# Patient Record
Sex: Female | Born: 1975 | Race: White | Hispanic: No | Marital: Married | Smoking: Never smoker
Health system: Southern US, Community
[De-identification: ages and names within clinical notes are randomized; demographics above are authoritative.]

## PROBLEM LIST (undated history)

## (undated) DIAGNOSIS — E039 Hypothyroidism, unspecified: Secondary | ICD-10-CM

## (undated) DIAGNOSIS — K219 Gastro-esophageal reflux disease without esophagitis: Secondary | ICD-10-CM

## (undated) DIAGNOSIS — E282 Polycystic ovarian syndrome: Secondary | ICD-10-CM

## (undated) DIAGNOSIS — F32A Depression, unspecified: Secondary | ICD-10-CM

## (undated) DIAGNOSIS — F329 Major depressive disorder, single episode, unspecified: Secondary | ICD-10-CM

## (undated) HISTORY — DX: Polycystic ovarian syndrome: E28.2

## (undated) HISTORY — PX: KNEE ARTHROSCOPY: SHX127

---

## 2009-06-19 ENCOUNTER — Inpatient Hospital Stay (HOSPITAL_COMMUNITY): Admission: AD | Admit: 2009-06-19 | Discharge: 2009-06-19 | Payer: Self-pay | Admitting: Obstetrics and Gynecology

## 2009-06-23 ENCOUNTER — Inpatient Hospital Stay (HOSPITAL_COMMUNITY): Admission: RE | Admit: 2009-06-23 | Discharge: 2009-06-25 | Payer: Self-pay | Admitting: Obstetrics and Gynecology

## 2010-05-19 LAB — CBC
HCT: 33.6 % — ABNORMAL LOW (ref 36.0–46.0)
Hemoglobin: 11 g/dL — ABNORMAL LOW (ref 12.0–15.0)
Hemoglobin: 8.8 g/dL — ABNORMAL LOW (ref 12.0–15.0)
MCHC: 33.2 g/dL (ref 30.0–36.0)
MCV: 82.2 fL (ref 78.0–100.0)
MCV: 83.1 fL (ref 78.0–100.0)
Platelets: 284 10*3/uL (ref 150–400)
Platelets: 326 10*3/uL (ref 150–400)
Platelets: 331 10*3/uL (ref 150–400)
RBC: 3.94 MIL/uL (ref 3.87–5.11)
RDW: 14.3 % (ref 11.5–15.5)
RDW: 14.5 % (ref 11.5–15.5)
WBC: 12.4 10*3/uL — ABNORMAL HIGH (ref 4.0–10.5)
WBC: 9.2 10*3/uL (ref 4.0–10.5)

## 2010-05-19 LAB — COMPREHENSIVE METABOLIC PANEL
Albumin: 2.9 g/dL — ABNORMAL LOW (ref 3.5–5.2)
Alkaline Phosphatase: 111 U/L (ref 39–117)
BUN: 6 mg/dL (ref 6–23)
Chloride: 107 mEq/L (ref 96–112)
Creatinine, Ser: 0.69 mg/dL (ref 0.4–1.2)
Glucose, Bld: 76 mg/dL (ref 70–99)
Total Bilirubin: 0.4 mg/dL (ref 0.3–1.2)

## 2010-05-19 LAB — TYPE AND SCREEN
ABO/RH(D): O NEG
Antibody Screen: NEGATIVE

## 2010-05-19 LAB — RH IMMUNE GLOB WKUP(>/=20WKS)(NOT WOMEN'S HOSP): Fetal Screen: NEGATIVE

## 2010-05-19 LAB — URIC ACID: Uric Acid, Serum: 4.1 mg/dL (ref 2.4–7.0)

## 2010-05-19 LAB — RPR: RPR Ser Ql: NONREACTIVE

## 2010-05-19 LAB — ABO/RH: ABO/RH(D): O NEG

## 2010-10-30 LAB — ABO/RH: RH Type: NEGATIVE

## 2010-10-30 LAB — HEPATITIS B SURFACE ANTIGEN: Hepatitis B Surface Ag: NEGATIVE

## 2010-10-30 LAB — GC/CHLAMYDIA PROBE AMP, GENITAL
Chlamydia: NEGATIVE
Gonorrhea: NEGATIVE

## 2010-10-30 LAB — RUBELLA ANTIBODY, IGM: Rubella: IMMUNE

## 2011-05-20 ENCOUNTER — Encounter (HOSPITAL_COMMUNITY): Payer: Self-pay | Admitting: Pharmacist

## 2011-05-27 ENCOUNTER — Encounter (HOSPITAL_COMMUNITY): Payer: Self-pay

## 2011-05-31 ENCOUNTER — Encounter (HOSPITAL_COMMUNITY): Payer: Self-pay

## 2011-05-31 ENCOUNTER — Encounter (HOSPITAL_COMMUNITY)
Admission: RE | Admit: 2011-05-31 | Discharge: 2011-05-31 | Disposition: A | Discharge status: Home / Self Care | Payer: 59 | Source: Ambulatory Visit | Attending: Obstetrics and Gynecology | Admitting: Obstetrics and Gynecology

## 2011-05-31 HISTORY — DX: Hypothyroidism, unspecified: E03.9

## 2011-05-31 HISTORY — DX: Gastro-esophageal reflux disease without esophagitis: K21.9

## 2011-05-31 LAB — CBC
MCH: 23 pg — ABNORMAL LOW (ref 26.0–34.0)
MCHC: 30 g/dL (ref 30.0–36.0)
MCV: 76.7 fL — ABNORMAL LOW (ref 78.0–100.0)
Platelets: 275 10*3/uL (ref 150–400)
RBC: 4.21 MIL/uL (ref 3.87–5.11)

## 2011-05-31 LAB — SURGICAL PCR SCREEN: MRSA, PCR: NEGATIVE

## 2011-05-31 NOTE — Patient Instructions (Signed)
YOUR PROCEDURE IS SCHEDULED ON:06/03/11  ENTER THROUGH THE MAIN ENTRANCE OF [facility] AT:0600 am  USE DESK PHONE AND DIAL [id number] TO INFORM us OF YOUR ARRIVAL  CALL [phone] IF YOU HAVE ANY QUESTIONS OR PROBLEMS PRIOR TO YOUR ARRIVAL.  REMEMBER: DO NOT EAT OR DRINK AFTER MIDNIGHT : Wed.  SPECIAL INSTRUCTIONS:   YOU MAY BRUSH YOUR TEETH THE MORNING OF SURGERY   TAKE THESE MEDICINES THE DAY OF SURGERY WITH SIP OF WATER:   DO NOT WEAR JEWELRY, EYE MAKEUP, LIPSTICK OR DARK FINGERNAIL POLISH DO NOT WEAR LOTIONS  DO NOT SHAVE FOR 48 HOURS PRIOR TO SURGERY  YOU WILL NOT BE ALLOWED TO DRIVE YOURSELF HOME.  NAME OF DRIVER:Jon- [phone]

## 2011-06-01 ENCOUNTER — Other Ambulatory Visit (HOSPITAL_COMMUNITY): Payer: 59

## 2011-06-03 ENCOUNTER — Encounter (HOSPITAL_COMMUNITY): Payer: Self-pay | Admitting: Anesthesiology

## 2011-06-03 ENCOUNTER — Inpatient Hospital Stay (HOSPITAL_COMMUNITY)
Admission: RE | Admit: 2011-06-03 | Discharge: 2011-06-05 | DRG: 766 | Disposition: A | Discharge status: Home / Self Care | Payer: 59 | Source: Ambulatory Visit | Attending: Obstetrics and Gynecology | Admitting: Obstetrics and Gynecology

## 2011-06-03 ENCOUNTER — Encounter (HOSPITAL_COMMUNITY)
Admission: RE | Disposition: A | Discharge status: Home / Self Care | Payer: Self-pay | Source: Ambulatory Visit | Attending: Obstetrics and Gynecology

## 2011-06-03 ENCOUNTER — Encounter (HOSPITAL_COMMUNITY): Payer: Self-pay | Admitting: *Deleted

## 2011-06-03 ENCOUNTER — Inpatient Hospital Stay (HOSPITAL_COMMUNITY): Payer: 59 | Admitting: Anesthesiology

## 2011-06-03 DIAGNOSIS — O09529 Supervision of elderly multigravida, unspecified trimester: Secondary | ICD-10-CM | POA: Diagnosis present

## 2011-06-03 DIAGNOSIS — O34219 Maternal care for unspecified type scar from previous cesarean delivery: Principal | ICD-10-CM | POA: Diagnosis present

## 2011-06-03 HISTORY — DX: Depression, unspecified: F32.A

## 2011-06-03 HISTORY — DX: Major depressive disorder, single episode, unspecified: F32.9

## 2011-06-03 LAB — TYPE AND SCREEN
ABO/RH(D): O NEG
Antibody Screen: NEGATIVE

## 2011-06-03 SURGERY — Surgical Case
Anesthesia: Spinal | Site: Abdomen | Wound class: Clean

## 2011-06-03 MED ORDER — SIMETHICONE 80 MG PO CHEW
80.0000 mg | CHEWABLE_TABLET | Freq: Three times a day (TID) | ORAL | Status: DC
  Administered 2011-06-03 – 2011-06-05 (×5): 80 mg via ORAL

## 2011-06-03 MED ORDER — SODIUM CHLORIDE 0.9 % IJ SOLN: 3.0000 mL | INTRAMUSCULAR | Status: DC | PRN

## 2011-06-03 MED ORDER — KETOROLAC TROMETHAMINE 30 MG/ML IJ SOLN
30.0000 mg | Freq: Four times a day (QID) | INTRAMUSCULAR | Status: AC | PRN
  Administered 2011-06-03: 30 mg via INTRAVENOUS
  Filled 2011-06-03: qty 1

## 2011-06-03 MED ORDER — DIPHENHYDRAMINE HCL 50 MG/ML IJ SOLN: 25.0000 mg | INTRAMUSCULAR | Status: DC | PRN

## 2011-06-03 MED ORDER — MORPHINE SULFATE (PF) 0.5 MG/ML IJ SOLN
INTRAMUSCULAR | Status: DC | PRN
  Administered 2011-06-03: .1 mg via INTRATHECAL

## 2011-06-03 MED ORDER — LANOLIN HYDROUS EX OINT: 1.0000 "application " | TOPICAL_OINTMENT | CUTANEOUS | Status: DC | PRN

## 2011-06-03 MED ORDER — KETOROLAC TROMETHAMINE 60 MG/2ML IM SOLN
INTRAMUSCULAR | Status: AC
  Administered 2011-06-03: 60 mg via INTRAMUSCULAR
  Filled 2011-06-03: qty 2

## 2011-06-03 MED ORDER — FAMOTIDINE 20 MG PO TABS
ORAL_TABLET | ORAL | Status: AC
  Filled 2011-06-03: qty 1

## 2011-06-03 MED ORDER — OXYTOCIN 10 UNIT/ML IJ SOLN
INTRAMUSCULAR | Status: DC | PRN
  Administered 2011-06-03: 20 [IU] via INTRAMUSCULAR

## 2011-06-03 MED ORDER — DIBUCAINE 1 % RE OINT: 1.0000 "application " | TOPICAL_OINTMENT | RECTAL | Status: DC | PRN

## 2011-06-03 MED ORDER — ONDANSETRON HCL 4 MG PO TABS: 4.0000 mg | ORAL_TABLET | ORAL | Status: DC | PRN

## 2011-06-03 MED ORDER — SCOPOLAMINE 1 MG/3DAYS TD PT72
1.0000 | MEDICATED_PATCH | TRANSDERMAL | Status: DC
  Administered 2011-06-03: 1.5 mg via TRANSDERMAL

## 2011-06-03 MED ORDER — KETOROLAC TROMETHAMINE 60 MG/2ML IM SOLN
60.0000 mg | Freq: Once | INTRAMUSCULAR | Status: AC | PRN
  Administered 2011-06-03: 60 mg via INTRAMUSCULAR

## 2011-06-03 MED ORDER — FAMOTIDINE 20 MG PO TABS
20.0000 mg | ORAL_TABLET | Freq: Once | ORAL | Status: AC
  Administered 2011-06-03: 20 mg via ORAL

## 2011-06-03 MED ORDER — MORPHINE SULFATE 0.5 MG/ML IJ SOLN
INTRAMUSCULAR | Status: AC
  Filled 2011-06-03: qty 10

## 2011-06-03 MED ORDER — KETOROLAC TROMETHAMINE 30 MG/ML IJ SOLN: 30.0000 mg | Freq: Four times a day (QID) | INTRAMUSCULAR | Status: AC | PRN

## 2011-06-03 MED ORDER — MEPERIDINE HCL 25 MG/ML IJ SOLN: 6.2500 mg | INTRAMUSCULAR | Status: DC | PRN

## 2011-06-03 MED ORDER — EPHEDRINE SULFATE 50 MG/ML IJ SOLN
INTRAMUSCULAR | Status: DC | PRN
  Administered 2011-06-03 (×6): 10 mg via INTRAVENOUS

## 2011-06-03 MED ORDER — OXYTOCIN 20 UNITS IN LACTATED RINGERS INFUSION - SIMPLE
INTRAVENOUS | Status: AC
  Filled 2011-06-03: qty 1000

## 2011-06-03 MED ORDER — HYDROMORPHONE HCL PF 1 MG/ML IJ SOLN: 0.2500 mg | INTRAMUSCULAR | Status: DC | PRN

## 2011-06-03 MED ORDER — SIMETHICONE 80 MG PO CHEW: 80.0000 mg | CHEWABLE_TABLET | ORAL | Status: DC | PRN

## 2011-06-03 MED ORDER — KETOROLAC TROMETHAMINE 30 MG/ML IJ SOLN: 15.0000 mg | Freq: Once | INTRAMUSCULAR | Status: DC | PRN

## 2011-06-03 MED ORDER — IBUPROFEN 600 MG PO TABS: 600.0000 mg | ORAL_TABLET | Freq: Four times a day (QID) | ORAL | Status: DC

## 2011-06-03 MED ORDER — IBUPROFEN 600 MG PO TABS
600.0000 mg | ORAL_TABLET | Freq: Four times a day (QID) | ORAL | Status: DC
  Administered 2011-06-03 – 2011-06-05 (×6): 600 mg via ORAL
  Filled 2011-06-03 (×4): qty 1
  Filled 2011-06-03: qty 2

## 2011-06-03 MED ORDER — EPHEDRINE 5 MG/ML INJ
INTRAVENOUS | Status: AC
  Filled 2011-06-03: qty 10

## 2011-06-03 MED ORDER — PHENYLEPHRINE HCL 10 MG/ML IJ SOLN
INTRAMUSCULAR | Status: DC | PRN
  Administered 2011-06-03 (×3): 80 ug via INTRAVENOUS
  Administered 2011-06-03: 40 ug via INTRAVENOUS
  Administered 2011-06-03 (×3): 80 ug via INTRAVENOUS

## 2011-06-03 MED ORDER — DEXTROSE IN LACTATED RINGERS 5 % IV SOLN: INTRAVENOUS | Status: DC

## 2011-06-03 MED ORDER — PHENYLEPHRINE 40 MCG/ML (10ML) SYRINGE FOR IV PUSH (FOR BLOOD PRESSURE SUPPORT)
PREFILLED_SYRINGE | INTRAVENOUS | Status: AC
  Filled 2011-06-03: qty 5

## 2011-06-03 MED ORDER — CLINDAMYCIN PHOSPHATE 900 MG/50ML IV SOLN
900.0000 mg | Freq: Once | INTRAVENOUS | Status: AC
  Administered 2011-06-03: 900 mg via INTRAVENOUS
  Filled 2011-06-03: qty 50

## 2011-06-03 MED ORDER — ONDANSETRON HCL 4 MG/2ML IJ SOLN
4.0000 mg | Freq: Three times a day (TID) | INTRAMUSCULAR | Status: DC | PRN
  Administered 2011-06-03: 4 mg via INTRAVENOUS

## 2011-06-03 MED ORDER — PROMETHAZINE HCL 25 MG/ML IJ SOLN: 6.2500 mg | INTRAMUSCULAR | Status: DC | PRN

## 2011-06-03 MED ORDER — DIPHENHYDRAMINE HCL 50 MG/ML IJ SOLN: 12.5000 mg | INTRAMUSCULAR | Status: DC | PRN

## 2011-06-03 MED ORDER — NALBUPHINE HCL 10 MG/ML IJ SOLN
5.0000 mg | INTRAMUSCULAR | Status: DC | PRN
  Filled 2011-06-03: qty 1

## 2011-06-03 MED ORDER — OXYCODONE-ACETAMINOPHEN 5-325 MG PO TABS
1.0000 | ORAL_TABLET | ORAL | Status: DC | PRN
  Administered 2011-06-03 (×2): 1 via ORAL
  Administered 2011-06-04: 2 via ORAL
  Administered 2011-06-04: 1 via ORAL
  Administered 2011-06-04: 2 via ORAL
  Administered 2011-06-04 (×2): 1 via ORAL
  Administered 2011-06-05: 2 via ORAL
  Administered 2011-06-05: 1 via ORAL
  Filled 2011-06-03: qty 2
  Filled 2011-06-03 (×2): qty 1
  Filled 2011-06-03: qty 2
  Filled 2011-06-03: qty 1
  Filled 2011-06-03: qty 2
  Filled 2011-06-03: qty 1
  Filled 2011-06-03 (×2): qty 2
  Filled 2011-06-03: qty 1

## 2011-06-03 MED ORDER — PRENATAL MULTIVITAMIN CH
1.0000 | ORAL_TABLET | Freq: Every day | ORAL | Status: DC
  Administered 2011-06-04 – 2011-06-05 (×2): 1 via ORAL
  Filled 2011-06-03 (×2): qty 1

## 2011-06-03 MED ORDER — FENTANYL CITRATE 0.05 MG/ML IJ SOLN
INTRAMUSCULAR | Status: DC | PRN
  Administered 2011-06-03: 25 ug via INTRATHECAL

## 2011-06-03 MED ORDER — OXYTOCIN 10 UNIT/ML IJ SOLN
INTRAMUSCULAR | Status: AC
  Filled 2011-06-03: qty 2

## 2011-06-03 MED ORDER — ONDANSETRON HCL 4 MG/2ML IJ SOLN: 4.0000 mg | INTRAMUSCULAR | Status: DC | PRN

## 2011-06-03 MED ORDER — PANTOPRAZOLE SODIUM 40 MG PO TBEC
40.0000 mg | DELAYED_RELEASE_TABLET | Freq: Every day | ORAL | Status: DC
  Administered 2011-06-03 – 2011-06-04 (×2): 40 mg via ORAL
  Filled 2011-06-03 (×2): qty 1

## 2011-06-03 MED ORDER — DIPHENHYDRAMINE HCL 25 MG PO CAPS: 25.0000 mg | ORAL_CAPSULE | ORAL | Status: DC | PRN

## 2011-06-03 MED ORDER — 0.9 % SODIUM CHLORIDE (POUR BTL) OPTIME
TOPICAL | Status: DC | PRN
  Administered 2011-06-03: 1000 mL

## 2011-06-03 MED ORDER — PHENYLEPHRINE 40 MCG/ML (10ML) SYRINGE FOR IV PUSH (FOR BLOOD PRESSURE SUPPORT)
PREFILLED_SYRINGE | INTRAVENOUS | Status: AC
  Filled 2011-06-03: qty 10

## 2011-06-03 MED ORDER — FENTANYL CITRATE 0.05 MG/ML IJ SOLN
INTRAMUSCULAR | Status: AC
  Filled 2011-06-03: qty 2

## 2011-06-03 MED ORDER — MEASLES, MUMPS & RUBELLA VAC ~~LOC~~ INJ
0.5000 mL | INJECTION | Freq: Once | SUBCUTANEOUS | Status: DC
  Filled 2011-06-03: qty 0.5

## 2011-06-03 MED ORDER — MEDROXYPROGESTERONE ACETATE 150 MG/ML IM SUSP: 150.0000 mg | INTRAMUSCULAR | Status: DC | PRN

## 2011-06-03 MED ORDER — TETANUS-DIPHTH-ACELL PERTUSSIS 5-2.5-18.5 LF-MCG/0.5 IM SUSP: 0.5000 mL | Freq: Once | INTRAMUSCULAR | Status: DC

## 2011-06-03 MED ORDER — DIPHENHYDRAMINE HCL 25 MG PO CAPS: 25.0000 mg | ORAL_CAPSULE | Freq: Four times a day (QID) | ORAL | Status: DC | PRN

## 2011-06-03 MED ORDER — BUPIVACAINE IN DEXTROSE 0.75-8.25 % IT SOLN
INTRATHECAL | Status: DC | PRN
  Administered 2011-06-03: 1.6 mL via INTRATHECAL

## 2011-06-03 MED ORDER — SENNOSIDES-DOCUSATE SODIUM 8.6-50 MG PO TABS
2.0000 | ORAL_TABLET | Freq: Every day | ORAL | Status: DC
  Administered 2011-06-03 – 2011-06-04 (×2): 2 via ORAL

## 2011-06-03 MED ORDER — WITCH HAZEL-GLYCERIN EX PADS: 1.0000 "application " | MEDICATED_PAD | CUTANEOUS | Status: DC | PRN

## 2011-06-03 MED ORDER — ONDANSETRON HCL 4 MG/2ML IJ SOLN
INTRAMUSCULAR | Status: AC
  Administered 2011-06-03: 4 mg via INTRAVENOUS
  Filled 2011-06-03: qty 2

## 2011-06-03 MED ORDER — ONDANSETRON HCL 4 MG/2ML IJ SOLN
INTRAMUSCULAR | Status: DC | PRN
  Administered 2011-06-03: 4 mg via INTRAVENOUS

## 2011-06-03 MED ORDER — ONDANSETRON HCL 4 MG/2ML IJ SOLN
INTRAMUSCULAR | Status: AC
  Filled 2011-06-03: qty 2

## 2011-06-03 MED ORDER — OXYTOCIN 20 UNITS IN LACTATED RINGERS INFUSION - SIMPLE
125.0000 mL/h | INTRAVENOUS | Status: AC
  Administered 2011-06-03: 125 mL/h via INTRAVENOUS

## 2011-06-03 MED ORDER — MENTHOL 3 MG MT LOZG: 1.0000 | LOZENGE | OROMUCOSAL | Status: DC | PRN

## 2011-06-03 MED ORDER — SODIUM CHLORIDE 0.9 % IV SOLN
1.0000 ug/kg/h | INTRAVENOUS | Status: DC | PRN
  Filled 2011-06-03: qty 2.5

## 2011-06-03 MED ORDER — NALOXONE HCL 0.4 MG/ML IJ SOLN: 0.4000 mg | INTRAMUSCULAR | Status: DC | PRN

## 2011-06-03 MED ORDER — LACTATED RINGERS IV SOLN
INTRAVENOUS | Status: DC
  Administered 2011-06-03 (×3): via INTRAVENOUS

## 2011-06-03 SURGICAL SUPPLY — 27 items
CHLORAPREP W/TINT 26ML (MISCELLANEOUS) ×2 IMPLANT
CLOTH BEACON ORANGE TIMEOUT ST (SAFETY) ×2 IMPLANT
DERMABOND ADVANCED (GAUZE/BANDAGES/DRESSINGS) ×1
DERMABOND ADVANCED .7 DNX12 (GAUZE/BANDAGES/DRESSINGS) ×1 IMPLANT
ELECT REM PT RETURN 9FT ADLT (ELECTROSURGICAL) ×2
ELECTRODE REM PT RTRN 9FT ADLT (ELECTROSURGICAL) ×1 IMPLANT
EXTRACTOR VACUUM M CUP 4 TUBE (SUCTIONS) IMPLANT
GLOVE BIO SURGEON STRL SZ 6.5 (GLOVE) ×2 IMPLANT
GLOVE BIOGEL PI IND STRL 7.0 (GLOVE) ×2 IMPLANT
GLOVE BIOGEL PI INDICATOR 7.0 (GLOVE) ×2
GOWN PREVENTION PLUS LG XLONG (DISPOSABLE) ×6 IMPLANT
KIT ABG SYR 3ML LUER SLIP (SYRINGE) ×2 IMPLANT
NEEDLE HYPO 25X5/8 SAFETYGLIDE (NEEDLE) ×2 IMPLANT
NS IRRIG 1000ML POUR BTL (IV SOLUTION) ×2 IMPLANT
PACK C SECTION WH (CUSTOM PROCEDURE TRAY) ×2 IMPLANT
SLEEVE SCD COMPRESS KNEE MED (MISCELLANEOUS) IMPLANT
STAPLER VISISTAT 35W (STAPLE) IMPLANT
SUT CHROMIC 0 CT 802H (SUTURE) IMPLANT
SUT CHROMIC 0 CTX 36 (SUTURE) ×4 IMPLANT
SUT MNCRL AB 3-0 PS2 27 (SUTURE) IMPLANT
SUT MON AB-0 CT1 36 (SUTURE) ×2 IMPLANT
SUT PDS AB 0 CTX 60 (SUTURE) ×2 IMPLANT
SUT PLAIN 0 NONE (SUTURE) IMPLANT
SUT VIC AB 4-0 KS 27 (SUTURE) ×2 IMPLANT
TOWEL OR 17X24 6PK STRL BLUE (TOWEL DISPOSABLE) ×4 IMPLANT
TRAY FOLEY CATH 14FR (SET/KITS/TRAYS/PACK) ×2 IMPLANT
WATER STERILE IRR 1000ML POUR (IV SOLUTION) ×2 IMPLANT

## 2011-06-03 NOTE — Anesthesia Postprocedure Evaluation (Signed)
Anesthesia Post Note  Patient: Martha Burton  Procedure(s) Performed: Procedure(s) (LRB): CESAREAN SECTION (N/A)  Anesthesia type: Spinal  Patient location: PACU  Post pain: Pain level controlled  Post assessment: Post-op Vital signs reviewed  Last Vitals:  Filed Vitals:   06/03/11 0830  BP:   Pulse:   Temp: 36.3 C  Resp:     Post vital signs: Reviewed  Level of consciousness: awake  Complications: No apparent anesthesia complications

## 2011-06-03 NOTE — Anesthesia Postprocedure Evaluation (Signed)
  Anesthesia Post-op Note  Patient: Martha Burton  Procedure(s) Performed: Procedure(s) (LRB): CESAREAN SECTION (N/A)  Patient Location: Mother/Baby  Anesthesia Type: Spinal  Level of Consciousness: awake, alert  and oriented  Airway and Oxygen Therapy: Patient Spontanous Breathing  Post-op Pain: none  Post-op Assessment: Post-op Vital signs reviewed, Patient's Cardiovascular Status Stable, No headache, No backache, No residual numbness and No residual motor weakness  Post-op Vital Signs: Reviewed and stable  Complications: No apparent anesthesia complications

## 2011-06-03 NOTE — Transfer of Care (Signed)
Immediate Anesthesia Transfer of Care Note  Patient: Martha Burton  Procedure(s) Performed: Procedure(s) (LRB): CESAREAN SECTION (N/A)  Patient Location: PACU  Anesthesia Type: Spinal  Level of Consciousness: awake, alert  and oriented  Airway & Oxygen Therapy: Patient Spontanous Breathing  Post-op Assessment: Report given to PACU RN and Post -op Vital signs reviewed and stable  Post vital signs: Reviewed and stable  Complications: No apparent anesthesia complications

## 2011-06-03 NOTE — Addendum Note (Signed)
Addendum  created 06/03/11 1936 by Christene Lye, CRNA   Modules edited:Notes Section

## 2011-06-03 NOTE — H&P (Signed)
36 yo G3P2 @ 39+3 wks presents for repeat c-section.  Past History - See [facility] C/section x 2 All - PCN  AF, VSS Gen - NAD ABd - gravid, NT Ext - NT, bilateral edema CV - RRR Lungs - clear\  A/P:  Prior c-section, desires repeat R/b/a discussed, informed consent

## 2011-06-03 NOTE — Anesthesia Preprocedure Evaluation (Signed)
Anesthesia Evaluation  Patient identified by MRN, date of birth, ID band Patient awake    Reviewed: Allergy & Precautions, H&P , NPO status , Patient's Chart, lab work & pertinent test results  Airway Mallampati: I TM Distance: >3 FB Neck ROM: full    Dental No notable dental hx.    Pulmonary neg pulmonary ROS,    Pulmonary exam normal       Cardiovascular negative cardio ROS      Neuro/Psych negative neurological ROS     GI/Hepatic negative GI ROS, Neg liver ROS,   Endo/Other  Hypothyroidism   Renal/GU negative Renal ROS  negative genitourinary   Musculoskeletal negative musculoskeletal ROS (+)   Abdominal Normal abdominal exam  (+)   Peds negative pediatric ROS (+)  Hematology negative hematology ROS (+)   Anesthesia Other Findings   Reproductive/Obstetrics (+) Pregnancy                           Anesthesia Physical Anesthesia Plan  ASA: II  Anesthesia Plan: Spinal   Post-op Pain Management:    Induction:   Airway Management Planned:   Additional Equipment:   Intra-op Plan:   Post-operative Plan:   Informed Consent: I have reviewed the patients History and Physical, chart, labs and discussed the procedure including the risks, benefits and alternatives for the proposed anesthesia with the patient or authorized representative who has indicated his/her understanding and acceptance.     Plan Discussed with: CRNA and Surgeon  Anesthesia Plan Comments: (1. Please give prophylactic neosynephrine for BP. Pt had severe fall in BP last time.)        Anesthesia Quick Evaluation

## 2011-06-03 NOTE — Consult Note (Signed)
Neonatology Note:  Attendance at C-section:  I was asked to attend this repeat C/S at term. The mother is a G3P2 O neg, GBS unknown with hypothyroidism. ROM at delivery, fluid clear. Infant vigorous with good spontaneous cry and tone. Needed only minimal bulb suctioning. Ap 9/9. Lungs clear to ausc in DR. To CN to care of Pediatrician.  Ardyn Forge, MD  

## 2011-06-03 NOTE — Anesthesia Procedure Notes (Signed)
Spinal  Patient location during procedure: OR Start time: 06/03/2011 7:41 AM Staffing Anesthesiologist: Brayton Caves R Performed by: anesthesiologist  Preanesthetic Checklist Completed: patient identified, site marked, surgical consent, pre-op evaluation, timeout performed, IV checked, risks and benefits discussed and monitors and equipment checked Spinal Block Patient position: sitting Prep: DuraPrep Patient monitoring: heart rate, cardiac monitor, continuous pulse ox and blood pressure Approach: midline Location: L3-4 Injection technique: single-shot Needle Needle type: Sprotte  Needle gauge: 24 G Needle length: 9 cm Assessment Sensory level: T4 Additional Notes Patient identified.  Risk benefits discussed including failed block, incomplete pain control, headache, nerve damage, paralysis, blood pressure changes, nausea, vomiting, reactions to medication both toxic or allergic, and postpartum back pain.  Patient expressed understanding and wished to proceed.  All questions were answered.  Sterile technique used throughout procedure.  CSF was clear.  No parasthesia or other complications.  Please see nursing notes for vital signs.

## 2011-06-03 NOTE — Anesthesia Postprocedure Evaluation (Deleted)
  Anesthesia Post-op Note  Patient: Martha Burton  Procedure(s) Performed: Procedure(s) (LRB): CESAREAN SECTION (N/A)  Patient Location: PACU  Anesthesia Type: Spinal  Level of Consciousness: awake, alert  and oriented  Airway and Oxygen Therapy: Patient Spontanous Breathing  Post-op Pain: none  Post-op Assessment: Post-op Vital signs reviewed and Patient's Cardiovascular Status Stable  Post-op Vital Signs: Reviewed and stable  Complications: No apparent anesthesia complications

## 2011-06-03 NOTE — Op Note (Signed)
Cesarean Section Procedure Note   Martha Burton  06/03/2011  Indications: Scheduled Proceedure/Maternal Request   Pre-operative Diagnosis: repeat cesarean section.   Post-operative Diagnosis: Same   Surgeon: Surgeon(s) and Role:    * Zelphia Cairo, MD - Primary   Assistants: none  Anesthesia: spinal   Procedure Details:  The patient was seen in the Holding Room. The risks, benefits, complications, treatment options, and expected outcomes were discussed with the patient. The patient concurred with the proposed plan, giving informed consent. identified as Martha Burton and the procedure verified as C-Section Delivery. A Time Out was held and the above information confirmed.  After induction of anesthesia, the patient was draped and prepped in the usual sterile manner. A transverse was made and carried down through the subcutaneous tissue to the fascia. Fascial incision was made and extended transversely. The fascia was separated from the underlying rectus tissue superiorly and inferiorly. The peritoneum was identified and entered. Peritoneal incision was extended longitudinally. The utero-vesical peritoneal reflection was incised transversely and the bladder flap was bluntly freed from the lower uterine segment. A low transverse uterine incision was made. Delivered from cephalic presentation was a female infant with Apgar scores of 9 at one minute and 9 at five minutes. Cord ph was not sent the umbilical cord was clamped and cut cord blood was obtained for evaluation. The placenta was removed Intact and appeared normal. The uterine outline, tubes and ovaries appeared normal}. The uterine incision was closed with running locked sutures of 0chromic gut.   Hemostasis was observed. Lavage was carried out until clear. Peritoneum was closed with 0 monocryl.  The fascia was then reapproximated with running sutures of 0PDS.  The skin was closed with 4-0Vicryl. Dermabond was placed over  incicision  Instrument, sponge, and needle counts were correct prior the abdominal closure and were correct at the conclusion of the case.     Estimated Blood Loss: 700cc  Urine Output: clear  Specimens: none  Complications: no complications  Disposition: PACU - hemodynamically stable.   Maternal Condition: stable   Baby condition / location:  nursery-stable  Attending Attestation: I was present and scrubbed for the entire procedure.   Signed: Surgeon(s): Zelphia Cairo, MD

## 2011-06-04 LAB — CBC
HCT: 26.5 % — ABNORMAL LOW (ref 36.0–46.0)
Hemoglobin: 8 g/dL — ABNORMAL LOW (ref 12.0–15.0)
MCHC: 30.2 g/dL (ref 30.0–36.0)
RBC: 3.46 MIL/uL — ABNORMAL LOW (ref 3.87–5.11)
WBC: 10.8 10*3/uL — ABNORMAL HIGH (ref 4.0–10.5)

## 2011-06-04 MED ORDER — FERROUS SULFATE 325 (65 FE) MG PO TABS
325.0000 mg | ORAL_TABLET | Freq: Two times a day (BID) | ORAL | Status: DC
  Administered 2011-06-04 – 2011-06-05 (×3): 325 mg via ORAL
  Filled 2011-06-04 (×3): qty 1

## 2011-06-04 NOTE — Progress Notes (Signed)
SW received consult for hx of Anxiety and Depression and met with MOB to complete assessment.  SW inquired about how she is feeling at this time and whether the symptoms occurred during the post partum period with her last children and she said that she felt depressed during her pregnancy with her second child and she is fine now.  It was evident in her tone that she did not want to discuss this any further.  SW thanked MOB for letting SW check on her and asked her to please call SW if she has any questions or concerns.  She thanked SW.

## 2011-06-04 NOTE — Progress Notes (Signed)
Subjective: Postpartum Day 1: Cesarean Delivery Patient reports incisional pain, tolerating PO and no problems voiding.    Objective: Vital signs in last 24 hours: Temp:  [97.3 F (36.3 C)-98.5 F (36.9 C)] 97.9 F (36.6 C) (04/05 0546) Pulse Rate:  [71-123] 73  (04/05 0546) Resp:  [16-23] 18  (04/05 0546) BP: (100-133)/(53-79) 111/67 mmHg (04/05 0546) SpO2:  [97 %-100 %] 97 % (04/05 0330) Weight:  [99.791 kg (220 lb)] 99.791 kg (220 lb) (04/04 1010)  Physical Exam:  General: alert and cooperative Lochia: appropriate Uterine Fundus: firm Incision: healing well DVT Evaluation: No evidence of DVT seen on physical exam.   Basename 06/04/11 0555  HGB 8.0*  HCT 26.5*    Assessment/Plan: Status post Cesarean section. Doing well postoperatively.  Continue current care  FEso4 .  Amamda Curbow G 06/04/2011, 8:11 AM

## 2011-06-05 MED ORDER — OXYCODONE-ACETAMINOPHEN 5-325 MG PO TABS: 1.0000 | ORAL_TABLET | ORAL | Status: AC | PRN

## 2011-06-05 MED ORDER — IBUPROFEN 600 MG PO TABS: 600.0000 mg | ORAL_TABLET | Freq: Four times a day (QID) | ORAL | Status: AC

## 2011-06-05 NOTE — Discharge Instructions (Signed)

## 2011-06-05 NOTE — Discharge Summary (Signed)
Obstetric Discharge Summary Reason for Admission: cesarean section Prenatal Procedures: ultrasound Intrapartum Procedures: cesarean: low cervical, transverse Postpartum Procedures: none Complications-Operative and Postpartum: none Hemoglobin  Date Value Range Status  06/04/2011 8.0* 12.0-15.0 (g/dL) Final     HCT  Date Value Range Status  06/04/2011 26.5* 36.0-46.0 (%) Final    Physical Exam:  General: alert and cooperative Lochia: appropriate Uterine Fundus: firm Incision: healing well, no significant erythema DVT Evaluation: No evidence of DVT seen on physical exam.  Discharge Diagnoses: Term Pregnancy-delivered  Discharge Information: Date: 06/05/2011 Activity: pelvic rest Diet: routine Medications: PNV, Ibuprofen and Percocet Condition: stable Instructions: refer to practice specific booklet Discharge to: home Follow-up Information    Schedule an appointment as soon as possible for a visit in 1 week to follow up.         Newborn Data: Live born female  Birth Weight: 8 lb 8 oz (3855 g) APGAR: 9, 9  Home with mother.  Aylyn Wenzler 06/05/2011, 9:12 AM

## 2011-06-06 ENCOUNTER — Inpatient Hospital Stay (HOSPITAL_COMMUNITY)
Admission: AD | Admit: 2011-06-06 | Discharge: 2011-06-06 | Disposition: A | Discharge status: Home / Self Care | Payer: 59 | Source: Ambulatory Visit | Attending: Obstetrics and Gynecology | Admitting: Obstetrics and Gynecology

## 2011-06-06 MED ORDER — RHO D IMMUNE GLOBULIN 1500 UNIT/2ML IJ SOLN
300.0000 ug | Freq: Once | INTRAMUSCULAR | Status: AC
  Administered 2011-06-06: 300 ug via INTRAMUSCULAR
  Filled 2011-06-06: qty 2

## 2011-06-07 ENCOUNTER — Encounter (HOSPITAL_COMMUNITY): Payer: Self-pay | Admitting: Obstetrics and Gynecology

## 2011-06-07 LAB — RH IG WORKUP (INCLUDES ABO/RH)
ABO/RH(D): O NEG
Gestational Age(Wks): 39
Unit division: 0

## 2011-12-27 ENCOUNTER — Ambulatory Visit (INDEPENDENT_AMBULATORY_CARE_PROVIDER_SITE_OTHER): Payer: 59 | Admitting: Family Medicine

## 2011-12-27 ENCOUNTER — Encounter: Payer: Self-pay | Admitting: Family Medicine

## 2011-12-27 VITALS — BP 122/88 | HR 76 | Ht 68.5 in | Wt 208.0 lb

## 2011-12-27 DIAGNOSIS — K219 Gastro-esophageal reflux disease without esophagitis: Secondary | ICD-10-CM

## 2011-12-27 DIAGNOSIS — E039 Hypothyroidism, unspecified: Secondary | ICD-10-CM

## 2011-12-27 DIAGNOSIS — M2550 Pain in unspecified joint: Secondary | ICD-10-CM

## 2011-12-27 DIAGNOSIS — R5381 Other malaise: Secondary | ICD-10-CM

## 2011-12-27 DIAGNOSIS — R5383 Other fatigue: Secondary | ICD-10-CM

## 2011-12-27 LAB — CBC WITH DIFFERENTIAL/PLATELET
Eosinophils Absolute: 0.1 10*3/uL (ref 0.0–0.7)
HCT: 41.7 % (ref 36.0–46.0)
Hemoglobin: 14.2 g/dL (ref 12.0–15.0)
Lymphs Abs: 2.2 10*3/uL (ref 0.7–4.0)
MCH: 27.3 pg (ref 26.0–34.0)
Monocytes Absolute: 0.4 10*3/uL (ref 0.1–1.0)
Monocytes Relative: 8 % (ref 3–12)
Neutro Abs: 2.5 10*3/uL (ref 1.7–7.7)
Neutrophils Relative %: 48 % (ref 43–77)
RBC: 5.2 MIL/uL — ABNORMAL HIGH (ref 3.87–5.11)

## 2011-12-27 LAB — COMPREHENSIVE METABOLIC PANEL
AST: 24 U/L (ref 0–37)
Albumin: 4.6 g/dL (ref 3.5–5.2)
Alkaline Phosphatase: 73 U/L (ref 39–117)
BUN: 12 mg/dL (ref 6–23)
Creat: 0.94 mg/dL (ref 0.50–1.10)
Glucose, Bld: 82 mg/dL (ref 70–99)
Potassium: 4.3 mEq/L (ref 3.5–5.3)

## 2011-12-27 LAB — T4, FREE: Free T4: 1.14 ng/dL (ref 0.80–1.80)

## 2011-12-27 LAB — T3, FREE: T3, Free: 3.1 pg/mL (ref 2.3–4.2)

## 2011-12-27 LAB — TSH: TSH: 1.91 u[IU]/mL (ref 0.350–4.500)

## 2011-12-27 MED ORDER — OMEPRAZOLE 40 MG PO CPDR: 40.0000 mg | DELAYED_RELEASE_CAPSULE | Freq: Every day | ORAL | Status: DC

## 2011-12-27 MED ORDER — MELOXICAM 15 MG PO TABS: 7.5000 mg | ORAL_TABLET | Freq: Every day | ORAL | Status: DC

## 2011-12-27 MED ORDER — DEXLANSOPRAZOLE 60 MG PO CPDR: 60.0000 mg | DELAYED_RELEASE_CAPSULE | Freq: Every day | ORAL | Status: DC

## 2011-12-27 NOTE — Progress Notes (Signed)
Chief Complaint  Patient presents with  . consult    heartburn(digestive issues),faitgue,joint pain swelling, aniexty,depression, weight gain -15lb within last 3 months, bp issues, fasting if need labs, pt had flu shot already at a health fair at work   HPI: She is almost 7 months post-partum, and stopped nursing a couple of weeks ago.  She presents with multiple complaints.  She reports feeling 'hungover" in the [facility], with joint pains, hard to move.  Ankles, feet joints, knees and back all hurt, and now also having pain into her upper joints.  Pain is symmetric, bilateral.  Fingers are swollen, hard to wear rings.  Feels bloated, stomach feels like it is filled with water, mushy.  She is 15 pounds heavier now than when she was 2 months post-partum.  Denies any change in diet.  Has been less active due to her joint pains, but she remains active.  Used to use elliptical, boot camp, power yoga (prior to last pregnancy).  BP at healthfair recently was 138/96 (a few weeks ago).  Having headaches that start posteriorly at her neck, and also behind her eyes.   Complaining of fatigue.  Complaining of "crazy bad" heartburn.  Prilosec helps, but sometimes needs to take it twice daily.  Trying to avoid foods which trigger but admits to not being perfect.  Has both constipation and diarrhea.  Denies any blood in stool.  +family h/o Crohn's.    Getting a lot of canker sores in her mouth, tongue and throat. Did Dr. Shaune Pascal test for gluten intolerance and all 9 were positive  Also complaining of anxiety and depression, concurrent with her feeling bad.  Gets massages to make her feel better, and certain spots trigger her to cry.  Patient was diagnosed with Hashimoto's thyroiditis in her 40's.  Treated x 3-5 years, but then gradually decreased dose, and came off and thyroid tests have been normal since then.  No period since prior to pregnancy.  Just stopped nursing 2 weeks ago, and has PCOS with  irregular periods her whole life Taking 1 motrin at bedtime.  Had taken 2 every 6 hours prior.  Past Medical History  Diagnosis Date  . Hypothyroidism     no meds currently (took meds x 3-5 yrs in her 36's)  . GERD (gastroesophageal reflux disease)   . Depression     pt states depression while pregnant with 2nd child  . PCOS (polycystic ovarian syndrome)    Past Surgical History  Procedure Date  . Cesarean section     x2  . Cesarean section 06/03/2011    Procedure: CESAREAN SECTION;  Surgeon: Zelphia Cairo, MD;  Location: [facility];  Service: Gynecology;  Laterality: N/A;  repeat   History   Social History  . Marital Status: Married    Spouse Name: N/A    Number of Children: 3  . Years of Education: N/A   Occupational History  . HR    Social History Main Topics  . Smoking status: Never Smoker   . Smokeless tobacco: Never Used  . Alcohol Use: Yes     pt drinks a couple times a week.  . Drug Use: No  . Sexually Active: Yes -- Female partner(s)    Birth Control/ Protection: Other-see comments     husband with vasectomy   Other Topics Concern  . Not on file   Social History Narrative   Works 3 days/week.  Has 3 daughters.  Husband works 50% of the time in [place].  Has a nanny (not live-in).  No relatives nearby   Family History  Problem Relation Age of Onset  . Hypertension Mother   . Asthma Mother   . Hypertension Father   . Heart disease Father 69    CABG  . Diabetes Father   . Cancer Maternal Aunt 42    inflammatory breast cancer; died at 69  . Crohn's disease Maternal Uncle   . Crohn's disease Maternal Grandmother   . Cancer Maternal Grandfather     lung (smoker)  . Diabetes Paternal Grandmother    Current outpatient prescriptions:calcium carbonate (TUMS EX) 750 MG chewable tablet, Chew 1-2 tablets by mouth at bedtime as needed. For reflux, Disp: , Rfl: ;  ibuprofen (ADVIL,MOTRIN) 400 MG tablet, Take 400 mg by mouth every 6 (six) hours as needed., Disp: ,  Rfl: ;  Omeprazole Magnesium (PRILOSEC OTC PO), Take by mouth 2 (two) times daily., Disp: , Rfl:   Allergies  Allergen Reactions  . Penicillins Rash   ROS:  Denies fevers, URI symptoms, no significant allergy symptoms, just mild.  +Hair loss, starting at 5 months post-partum.  Denies chest pain, palpitations.  +heartburn, diarrhea/constipation.  Denies bleeding, bruising, rashes. +depression/anxiety, joint pains, symmetric, multiple.  Denies urinary complaints.  PHYSICAL EXAM: BP 134/90  Pulse 76  Ht 5' 8.5" (1.74 m)  Wt 208 lb (94.348 kg)  BMI 31.17 kg/m2  Breastfeeding? No 122/88 Pleasant, well developed female, intermittently looking frustrated, on verge of tears.  In no apparent distress HEENT:  PERRL, EOMI, conjunctiva clear.  OP clear Neck: no lymphadenopathy, thyromegaly or mass Heart: regular rate and rhythm without murmur Lungs: clear bilaterally, with good air movement. Back: no spine tenderness +R SI joint tenderness, and pyriformis spasm. Diffusely tender elsewhere along back on chest (no true trigger spots, but diffusely tender) Abdomen: soft, +Epigastric tenderness.  No rebound tenderness or guarding.  No organomegaly Extremities: no pitting edema Skin: normal  ASSESSMENT/PLAN:  1. Unspecified hypothyroidism  TSH, T3, Free, T4, Free  2. Arthralgia  ANA, Sedimentation rate, meloxicam (MOBIC) 15 MG tablet  3. Other malaise and fatigue  Comprehensive metabolic panel, CBC with Differential, Vitamin D 25 hydroxy, TSH  4. GERD (gastroesophageal reflux disease)  omeprazole (PRILOSEC) 40 MG capsule   Check labs for thyroid, causes for fatigue, inflammation, and autoimmune dz. If labs all normal, consider trial of Cymbalta, vs other antidepressant, but consider change to Cymbalta if pain continues despite improvement in mood. (she was hesitant to consider Cymbalta, recalled hearing some "bad ratings").  Consider fibromyalgia.  Consider gluten intolerance.  Doubt celiac  disease.  I don't think Celiac panel would be helpful at this time.   Consider gluten-free trial to see if she feels better  GERD, suboptimally controlled.  Reviewed reflux diet, precautions. Dexilant samples #15 given.  Change over to omeprazole 40mg  daily once samples are completed. rx 40mg  If symptoms not as well controlled on omeprazole, then call for Dexilant Rx.    mobic rx, to be used daily to help with arthralgias.  Risks/side effects reviewed.  Take along with the PPI.  NSAID precautions reviewed.  Will contact with lab result when available  45 minute face to face visit, more than 1/2 spent in counseling.

## 2011-12-27 NOTE — Patient Instructions (Addendum)
Take Dexilant samples once daily. Don't start the omeprazole until after you finish the Dexilant (they are the same type of medication).  Consider taking a probiotic to help with the bowels (ie Align).  Take the meloxicam with food once daily, if needed for pain.  This is in place of Motrin--don't take any other pain reliever medications with this medication, other than acetaminophen.  (tylenol products are okay, but don't take aleve, aspirin, etc)  We will be in touch with your lab results soon.  Consider seeing a chiropractor (ie Dr. Thereasa Distance at [facility] on [address]) for your SI joint pain (low back pain).

## 2011-12-28 LAB — SEDIMENTATION RATE: Sed Rate: 1 mm/hr (ref 0–22)

## 2011-12-28 LAB — VITAMIN D 25 HYDROXY (VIT D DEFICIENCY, FRACTURES): Vit D, 25-Hydroxy: 28 ng/mL — ABNORMAL LOW (ref 30–89)

## 2011-12-31 ENCOUNTER — Telehealth: Payer: Self-pay | Admitting: Family Medicine

## 2011-12-31 DIAGNOSIS — G4700 Insomnia, unspecified: Secondary | ICD-10-CM | POA: Insufficient documentation

## 2011-12-31 DIAGNOSIS — K219 Gastro-esophageal reflux disease without esophagitis: Secondary | ICD-10-CM

## 2011-12-31 MED ORDER — TRAZODONE HCL 50 MG PO TABS
50.0000 mg | ORAL_TABLET | Freq: Every evening | ORAL | Status: AC | PRN
Start: 2011-12-31 — End: ?

## 2011-12-31 NOTE — Telephone Encounter (Signed)
Patient texted, explaining that she continues to have trouble sleeping (but pain is improved since taking Mobic, and stomach is better since using Dexilant and going gluten-free, as documented in result note from labs).  She recalls taking Trazadone 50mg  in the past with good results.  Okay to restart Trazadone--med sent to pharmacy

## 2012-02-07 ENCOUNTER — Telehealth: Payer: Self-pay | Admitting: Family Medicine

## 2012-02-07 DIAGNOSIS — F329 Major depressive disorder, single episode, unspecified: Secondary | ICD-10-CM

## 2012-02-07 MED ORDER — BUPROPION HCL ER (XL) 150 MG PO TB24: ORAL_TABLET | ORAL | Status: DC

## 2012-02-07 NOTE — Telephone Encounter (Signed)
Patient left me a detailed voicemail--Patient reports being under a lot of stress, and thinks she is now agreeable to start an antidepressant. She has done her own research, and wants to start Wellbutrin.  Also asking for recommendation for therapist.  I left detailed message with names/numbers of Berniece Andreas and Maxcine Ham for therapy.  I told her about using Wellbutring 150mg  once daily for a week, then increase to 300mg , and if insurance takes an issue with this med, it is likely due to the quantity being 60, and should change to 30.  I have asked her to contact me with target symptoms (that she is currently having), and to schedule follow-up in 4-6 weeks.

## 2012-02-28 ENCOUNTER — Telehealth: Payer: Self-pay | Admitting: Family Medicine

## 2012-02-28 NOTE — Telephone Encounter (Signed)
The instructions were to take 1 tablet for a week, then increase to 2 tabs if tolerated.  If she is still on just 1 tablet daily, then rx Wellbutrin XL 150 mg DAW (brand only) with instructions to take 1 tablet qAM, and increase to 2 tablets qAM in 1-2 weeks #180 with 1 refill.  If she is happy with how the 150mg  is working, and prefers to stay at that dose (ie feels like moods are back to normal at this dose), then can rx just #90 with 1 refill.  If she is taking 2 tablets daily, then rx the 300mg  tablet (DAW) and #90 with 1 refill.  Please clarify with patient and send rx.  Thanks

## 2012-03-03 ENCOUNTER — Other Ambulatory Visit: Payer: Self-pay | Admitting: Family Medicine

## 2012-03-03 DIAGNOSIS — F329 Major depressive disorder, single episode, unspecified: Secondary | ICD-10-CM

## 2012-03-03 MED ORDER — BUPROPION HCL ER (XL) 150 MG PO TB24: ORAL_TABLET | ORAL | Status: DC

## 2012-03-03 NOTE — Telephone Encounter (Signed)
Patient states that she would like the RX for the Wellburtin XL 150 mg sent to her pharmacy for a 90 day supply. She would like the brand name sent. She said she is tolerating the 150 mg well. I sent the 90 day supply to her pharmacy of choice. CLS

## 2012-05-02 ENCOUNTER — Other Ambulatory Visit: Payer: Self-pay | Admitting: Family Medicine

## 2012-06-19 ENCOUNTER — Ambulatory Visit (INDEPENDENT_AMBULATORY_CARE_PROVIDER_SITE_OTHER): Payer: 59 | Admitting: Family Medicine

## 2012-06-19 ENCOUNTER — Encounter: Payer: Self-pay | Admitting: Family Medicine

## 2012-06-19 VITALS — BP 124/76 | HR 84 | Temp 97.7°F | Ht 68.5 in | Wt 199.0 lb

## 2012-06-19 DIAGNOSIS — K219 Gastro-esophageal reflux disease without esophagitis: Secondary | ICD-10-CM

## 2012-06-19 DIAGNOSIS — J020 Streptococcal pharyngitis: Secondary | ICD-10-CM

## 2012-06-19 DIAGNOSIS — J029 Acute pharyngitis, unspecified: Secondary | ICD-10-CM

## 2012-06-19 DIAGNOSIS — G4700 Insomnia, unspecified: Secondary | ICD-10-CM

## 2012-06-19 DIAGNOSIS — F329 Major depressive disorder, single episode, unspecified: Secondary | ICD-10-CM | POA: Insufficient documentation

## 2012-06-19 MED ORDER — AZITHROMYCIN 250 MG PO TABS
ORAL_TABLET | ORAL | Status: AC
Start: 2012-06-19 — End: ?

## 2012-06-19 MED ORDER — DEXLANSOPRAZOLE 60 MG PO CPDR: DELAYED_RELEASE_CAPSULE | ORAL | Status: AC

## 2012-06-19 NOTE — Progress Notes (Signed)
Chief Complaint  Patient presents with  . Sore Throat    started a few days ago-state this is the worst ST she has ever had. Has a lump on the left side of her throat. B/L ear pain and post nasal drip. Strep test - POSITIVE.   Started with sore throat and mild congestion 3 days ago.  She thought it was just a cold, but then woke up with a fever 2 nights ago (T 101).  Sore throat has been progressively getting worse.  Needed to take a percocet from recent knee surgery.  She has also noticed swollen gland on L neck.  Has some postnasal drainage due to allergies. Her girls are also sick, with what she thought was the same cold.  She has been taking 2 motrin every 6 hours, with some relief of pain.  She had recent knee surgery.  She was doing yoga program, and tore meniscus with the activity, as well as other abnormalities within knee found.  Had knee arthroscopy 5 weeks ago and doing much better.  Depression:  Much improved on wellbutrin.  Only tolerating it once daily, but can now take it every day. Needed to change to brand only, and stay on the 150mg . Sometimes has some trouble sleeping--so if she forgets to take it before 9 am, she doesn't take it at all.  Trazadone sometimes helps with sleep.  She doesn't like to take it unless she needs to.  Tried her husband's Remus Loffler (just 1/2) once and it worked great, but she wouldn't want to take it when her husband it not home (he works in [place] 1/2 time).  GERD:  Did really well with the Dexilant.  Certain foods trigger symptoms worse.  Dexilant has worked better than any of the other PPI's she has tried.  Isn't out yet, but would like more refills.  Uses it just prn (about 2x/week). Denies side effect, dysphagia.  Past Medical History  Diagnosis Date  . Hypothyroidism     no meds currently (took meds x 3-5 yrs in her 39's)  . GERD (gastroesophageal reflux disease)   . Depression     pt states depression while pregnant with 2nd child  . PCOS  (polycystic ovarian syndrome)    Past Surgical History  Procedure Laterality Date  . Cesarean section      x2  . Cesarean section  06/03/2011    Procedure: CESAREAN SECTION;  Surgeon: Zelphia Cairo, MD;  Location: [facility];  Service: Gynecology;  Laterality: N/A;  repeat  . Knee arthroscopy Right    History   Social History  . Marital Status: Married    Spouse Name: N/A    Number of Children: 3  . Years of Education: N/A   Occupational History  . HR    Social History Main Topics  . Smoking status: Never Smoker   . Smokeless tobacco: Never Used  . Alcohol Use: Yes     Comment: pt drinks a couple times a week.  . Drug Use: No  . Sexually Active: Yes -- Female partner(s)    Birth Control/ Protection: Other-see comments     Comment: husband with vasectomy   Other Topics Concern  . Not on file   Social History Narrative   Works 3 days/week.  Has 3 daughters.  Husband works 50% of the time in [place].  Has a nanny (not live-in).  No relatives nearby   Current Outpatient Prescriptions on File Prior to Visit  Medication Sig Dispense Refill  .  buPROPion (WELLBUTRIN XL) 150 MG 24 hr tablet Take one tablet by mouth each morning for 1 week.  If tolerating, then increase to 2 tablets once daily  90 tablet  1  . ibuprofen (ADVIL,MOTRIN) 400 MG tablet Take 400 mg by mouth every 6 (six) hours as needed.      . calcium carbonate (TUMS EX) 750 MG chewable tablet Chew 1-2 tablets by mouth at bedtime as needed. For reflux      . traZODone (DESYREL) 50 MG tablet Take 1 tablet (50 mg total) by mouth at bedtime as needed for sleep.  30 tablet  3   No current facility-administered medications on file prior to visit.   Allergies  Allergen Reactions  . Penicillins Rash   ROS:  +fever, URI vs allergy symptoms, only mild cough, no shortness of breath.  +sore throat.  Denies chest pain, palpitations, nausea, vomiting, diarrhea, abdominal pain, skin rash, dysphagia, bleeding/bruising or other  concerns except as per HPI  PHYSICAL EXAM: BP 124/76  Pulse 84  Temp(Src) 97.7 F (36.5 C) (Oral)  Ht 5' 8.5" (1.74 m)  Wt 199 lb (90.266 kg)  BMI 29.81 kg/m2  LMP 04/12/2012  Breastfeeding? No Pleasant female, in no acute distress HEENT:  PERRL, EOMI, conjunctiva clear.  TM's and EAC's normal.  OP with tonsillar enlargement, erythema and mild exudates.  No asymmetry, mucus membranes moist. Neck: tender, mildly enlarged left anterior cervical radiculopathy Heart: mild tachycardia (rate approx 100 on MD's exam), no murmur Lungs: clear bilaterally Skin: no rash Psych: normal mood, affect, hygiene and grooming Abdomen: soft, nontender  ASSESSMENT/PLAN: Sore throat - Plan: Rapid Strep A  Streptococcal sore throat - Plan: azithromycin (ZITHROMAX) 250 MG tablet  GERD (gastroesophageal reflux disease) - Plan: dexlansoprazole (DEXILANT) 60 MG capsule  Depressive disorder, not elsewhere classified  Insomnia  Strep pharyngitis:  PCN-allergic.  Treat with z-pak.  Risks/side effects reviewed.  Depression--continue Wellbutrin at current low dose. Insomnia: Consider using trazadone more regularly, and can take up to 100mg  (2 tabs) at one time.  Try this when husband is home first.  I have no problems rx'ing ambien to be used very sporadically/infrequently, but just when another adult is home, based on the effect it had on her.  GERD:  Refill Dexilant, continue prn use  Allergies--continue clairitin +/-D as needed

## 2012-06-19 NOTE — Patient Instructions (Addendum)

## 2012-06-20 ENCOUNTER — Ambulatory Visit: Payer: Self-pay | Admitting: Medical

## 2012-10-23 ENCOUNTER — Telehealth: Payer: Self-pay | Admitting: *Deleted

## 2012-10-23 DIAGNOSIS — F329 Major depressive disorder, single episode, unspecified: Secondary | ICD-10-CM

## 2012-10-23 MED ORDER — BUPROPION HCL ER (XL) 300 MG PO TB24: 300.0000 mg | ORAL_TABLET | Freq: Every day | ORAL | Status: DC

## 2012-10-23 NOTE — Telephone Encounter (Signed)
If she feels that her moods aren't improved enough with 150, then fine to increase to 300mg .  I know that she plans to move to [facility] soon for her husband's job.  Okay to fill 300mg  #30 with 5 refills, and have her touch base with me if she is having problems

## 2012-10-23 NOTE — Telephone Encounter (Signed)
Patient called and stated that when she last saw you she was told that it was okay to go up to 300mg  from 150mg  on her Wellbutrin. At that time she did not increase the dosage. She would like to know if it is still okay for her to do that now and if so can you call in a new rx for the 300mg  or does she need to come in for an appointment?

## 2012-10-23 NOTE — Telephone Encounter (Signed)
Done

## 2012-10-23 NOTE — Telephone Encounter (Signed)
Left message for patient to return my call.

## 2013-01-22 ENCOUNTER — Telehealth: Payer: Self-pay | Admitting: Internal Medicine

## 2013-01-22 ENCOUNTER — Other Ambulatory Visit: Payer: Self-pay | Admitting: *Deleted

## 2013-01-22 DIAGNOSIS — F329 Major depressive disorder, single episode, unspecified: Secondary | ICD-10-CM

## 2013-01-22 MED ORDER — BUPROPION HCL ER (XL) 300 MG PO TB24: 300.0000 mg | ORAL_TABLET | Freq: Every day | ORAL | Status: AC

## 2013-01-22 NOTE — Telephone Encounter (Signed)
Done

## 2013-01-22 NOTE — Telephone Encounter (Signed)
Refill request for wellbutrin-XL tab to [facility]  

## 2013-01-22 NOTE — Telephone Encounter (Signed)
Ok for #90 with no refills (she moved to [place])

## 2013-01-22 NOTE — Telephone Encounter (Signed)
Refill request for wellbutrin-XL tab to [facility]

## 2013-01-29 ENCOUNTER — Telehealth: Payer: Self-pay | Admitting: Family Medicine

## 2013-01-29 ENCOUNTER — Other Ambulatory Visit: Payer: Self-pay | Admitting: *Deleted

## 2013-01-29 MED ORDER — BUPROPION HCL ER (XL) 150 MG PO TB24: 150.0000 mg | ORAL_TABLET | Freq: Every day | ORAL | Status: AC

## 2013-01-29 NOTE — Telephone Encounter (Signed)
ok 

## 2013-01-29 NOTE — Telephone Encounter (Signed)
Done

## 2013-01-29 NOTE — Telephone Encounter (Signed)
Needs refill on wellbutrin XL  BRAND NAME, she needs to go back to 150 mg, she can not sleep on 300  90 day supply    [facility]  [phone]   Pt asked that we  Call in refill to this number

## 2013-06-26 ENCOUNTER — Encounter (HOSPITAL_BASED_OUTPATIENT_CLINIC_OR_DEPARTMENT_OTHER): Payer: Self-pay | Admitting: Emergency Medicine

## 2013-06-26 ENCOUNTER — Emergency Department (HOSPITAL_BASED_OUTPATIENT_CLINIC_OR_DEPARTMENT_OTHER): Payer: 59

## 2013-06-26 ENCOUNTER — Emergency Department (HOSPITAL_BASED_OUTPATIENT_CLINIC_OR_DEPARTMENT_OTHER)
Admission: EM | Admit: 2013-06-26 | Discharge: 2013-06-26 | Disposition: A | Discharge status: Home / Self Care | Payer: 59 | Attending: Emergency Medicine | Admitting: Emergency Medicine

## 2013-06-26 DIAGNOSIS — IMO0002 Reserved for concepts with insufficient information to code with codable children: Secondary | ICD-10-CM | POA: Insufficient documentation

## 2013-06-26 DIAGNOSIS — S0003XA Contusion of scalp, initial encounter: Secondary | ICD-10-CM | POA: Insufficient documentation

## 2013-06-26 DIAGNOSIS — Z8639 Personal history of other endocrine, nutritional and metabolic disease: Secondary | ICD-10-CM | POA: Insufficient documentation

## 2013-06-26 DIAGNOSIS — S0083XA Contusion of other part of head, initial encounter: Secondary | ICD-10-CM | POA: Insufficient documentation

## 2013-06-26 DIAGNOSIS — S1093XA Contusion of unspecified part of neck, initial encounter: Principal | ICD-10-CM

## 2013-06-26 DIAGNOSIS — K219 Gastro-esophageal reflux disease without esophagitis: Secondary | ICD-10-CM | POA: Insufficient documentation

## 2013-06-26 DIAGNOSIS — F329 Major depressive disorder, single episode, unspecified: Secondary | ICD-10-CM | POA: Insufficient documentation

## 2013-06-26 DIAGNOSIS — Y9339 Activity, other involving climbing, rappelling and jumping off: Secondary | ICD-10-CM | POA: Insufficient documentation

## 2013-06-26 DIAGNOSIS — F3289 Other specified depressive episodes: Secondary | ICD-10-CM | POA: Insufficient documentation

## 2013-06-26 DIAGNOSIS — Z880 Allergy status to penicillin: Secondary | ICD-10-CM | POA: Insufficient documentation

## 2013-06-26 DIAGNOSIS — Y929 Unspecified place or not applicable: Secondary | ICD-10-CM | POA: Insufficient documentation

## 2013-06-26 DIAGNOSIS — Z79899 Other long term (current) drug therapy: Secondary | ICD-10-CM | POA: Insufficient documentation

## 2013-06-26 DIAGNOSIS — Z792 Long term (current) use of antibiotics: Secondary | ICD-10-CM | POA: Insufficient documentation

## 2013-06-26 DIAGNOSIS — Z862 Personal history of diseases of the blood and blood-forming organs and certain disorders involving the immune mechanism: Secondary | ICD-10-CM | POA: Insufficient documentation

## 2013-06-26 MED ORDER — HYDROCODONE-ACETAMINOPHEN 5-325 MG PO TABS: 1.0000 | ORAL_TABLET | Freq: Four times a day (QID) | ORAL | Status: AC | PRN

## 2013-06-26 NOTE — ED Provider Notes (Signed)
CSN: [id number]     Arrival date & time 06/26/13  1659 History   First MD Initiated Contact with Patient 06/26/13 1720     Chief Complaint  Patient presents with  . Facial Injury     (Consider location/radiation/quality/duration/timing/severity/associated sxs/prior Treatment) HPI Comments: Pt states that she was falling asleep last night and had a dream where she was falling and it startled her. Pt states that the jump up and hit her left eye on the corner of the night stand. No loc with injury. Pt states that she had swelling and bruising immediately. Pt state that her left cheek hurts. No blurred vision.  The history is provided by the patient. No language interpreter was used.    Past Medical History  Diagnosis Date  . Hypothyroidism     no meds currently (took meds x 3-5 yrs in her 6020's)  . GERD (gastroesophageal reflux disease)   . Depression     pt states depression while pregnant with 2nd child  . PCOS (polycystic ovarian syndrome)    Past Surgical History  Procedure Laterality Date  . Cesarean section      x2  . Cesarean section  06/03/2011    Procedure: CESAREAN SECTION;  Surgeon: Zelphia CairoGretchen Adkins, MD;  Location: [facility];  Service: Gynecology;  Laterality: N/A;  repeat  . Knee arthroscopy Right    Family History  Problem Relation Age of Onset  . Hypertension Mother   . Asthma Mother   . Hypertension Father   . Heart disease Father [id number]    CABG  . Diabetes Father   . Cancer Maternal Aunt 42    inflammatory breast cancer; died at 1650  . Crohn's disease Maternal Uncle   . Crohn's disease Maternal Grandmother   . Cancer Maternal Grandfather     lung (smoker)  . Diabetes Paternal Grandmother    History  Substance Use Topics  . Smoking status: Never Smoker   . Smokeless tobacco: Never Used  . Alcohol Use: Yes     Comment: pt drinks a couple times a week.   OB History   Grav Para Term Preterm Abortions TAB SAB Ect Mult Living   3 3 3       3      Review of  Systems  Constitutional: Negative.   Respiratory: Negative.   Cardiovascular: Negative.       Allergies  Penicillins  Home Medications   Prior to Admission medications   Medication Sig Start Date End Date Taking? Authorizing Provider  azithromycin (ZITHROMAX) 250 MG tablet Take 2 tablets today, then 1 tablet daily on days 2-5 06/19/12   Joselyn ArrowEve Knapp, MD  buPROPion (WELLBUTRIN XL) 150 MG 24 hr tablet Take 1 tablet (150 mg total) by mouth daily. 01/29/13   Joselyn ArrowEve Knapp, MD  buPROPion (WELLBUTRIN XL) 300 MG 24 hr tablet Take 1 tablet (300 mg total) by mouth daily. 01/22/13   Joselyn ArrowEve Knapp, MD  calcium carbonate (TUMS EX) 750 MG chewable tablet Chew 1-2 tablets by mouth at bedtime as needed. For reflux    Historical Provider, MD  dexlansoprazole (DEXILANT) 60 MG capsule take 1 capsule by mouth once daily 06/19/12   Joselyn ArrowEve Knapp, MD  ibuprofen (ADVIL,MOTRIN) 400 MG tablet Take 400 mg by mouth every 6 (six) hours as needed.    Historical Provider, MD  loratadine (CLARITIN) 10 MG tablet Take 10 mg by mouth daily.    Historical Provider, MD  traZODone (DESYREL) 50 MG tablet Take 1 tablet (50 mg  total) by mouth at bedtime as needed for sleep. 12/31/11   Joselyn ArrowEve Knapp, MD   BP 137/89  Pulse 59  Temp(Src) 98 F (36.7 C) (Oral)  Resp 20  Ht 5\' 9"  (1.753 m)  Wt 190 lb (86.183 kg)  BMI 28.05 kg/m2  SpO2 100%  LMP 05/29/2013 Physical Exam  Vitals reviewed. Constitutional: She is oriented to person, place, and time. She appears well-developed and well-nourished.  HENT:  Right Ear: External ear normal.  Left Ear: External ear normal.  Mouth/Throat: Oropharynx is clear and moist.  Bruising and swelling noted around the lateral left eye. Tenderness with palpation below the eye  Eyes: Conjunctivae and EOM are normal. Pupils are equal, round, and reactive to light.  Neck: Normal range of motion. Neck supple.  Cardiovascular: Normal rate and regular rhythm.   Pulmonary/Chest: Effort normal.  Neurological: She is  alert and oriented to person, place, and time. Coordination normal.  Skin: Skin is warm and dry.  Psychiatric: She has a normal mood and affect.    ED Course  Procedures (including critical care time) Labs Review Labs Reviewed - No data to display  Imaging Review Ct Maxillofacial Wo Cm  06/26/2013   CLINICAL DATA:  Hip left-sided face on night stand. Facial numbness. Facial bruising.  EXAM: CT MAXILLOFACIAL WITHOUT CONTRAST  TECHNIQUE: Multidetector CT imaging of the maxillofacial structures was performed. Multiplanar CT image reconstructions were also generated. A small metallic BB was placed on the right temple in order to reliably differentiate right from left.  COMPARISON:  None.  FINDINGS: There is no visible facial fracture. Mild left malar soft tissue swelling without significant hematoma. Slight left preseptal periorbital soft tissue swelling without postseptal or intraconal hemorrhage. Both globes are intact. There is no blowout injury. No nasal bone fracture. No acute sinus fluid collection. Moderate retention cyst right maxillary sinus. Nasal septal deviation left to right with spurring approximately 5 mm. Upper cervical region unremarkable. Negative intracranial compartment. No middle ear or mastoid fluid. Retromalar soft tissues are intact. Unremarkable nasopharynx and neck soft tissues.  IMPRESSION: Mild malar soft tissue swelling on the left without significant hematoma. No facial fracture. No blowout injury.   Electronically Signed   By: Davonna BellingJohn  Curnes M.D.   On: 06/26/2013 18:24     EKG Interpretation None      MDM   Final diagnoses:  Facial contusion    No facial fracture noted.eom's intact. Will given vicodin for pain and pt can follow up with pcp for worsening symptoms    Teressa LowerVrinda Ivery Michalski, NP 06/26/13 [id number]

## 2013-06-26 NOTE — Discharge Instructions (Signed)

## 2013-06-26 NOTE — ED Notes (Signed)
Was hit her left eyelid on the corner of a wooden bedside table. Bruising and pain.

## 2013-06-27 NOTE — ED Provider Notes (Signed)
Medical screening examination/treatment/procedure(s) were performed by non-physician practitioner and as supervising physician I was immediately available for consultation/collaboration.   EKG Interpretation None        Kristen N Ward, DO 06/27/13 0005 

## 2013-12-31 ENCOUNTER — Encounter (HOSPITAL_BASED_OUTPATIENT_CLINIC_OR_DEPARTMENT_OTHER): Payer: Self-pay | Admitting: Emergency Medicine

## 2014-12-22 IMAGING — CT CT MAXILLOFACIAL W/O CM
3 series · 16 of 47 positions shown, 19 images · non-contrast
Comparison: None.

CLINICAL DATA: Hip left-sided face on night stand. Facial numbness.
Facial bruising.

EXAM:
CT MAXILLOFACIAL WITHOUT CONTRAST
TECHNIQUE: Multidetector CT imaging of the maxillofacial structures was
performed. Multiplanar CT image reconstructions were also generated.
A small metallic BB was placed on the right temple in order to
reliably differentiate right from left.

[Series 3: maxillofacial 2.0 h30s st · axial · 0.35mm/px · z∈[-187,-43]mm · 10 of 84 slices shown, 13 images]
[im 6/84  brain]
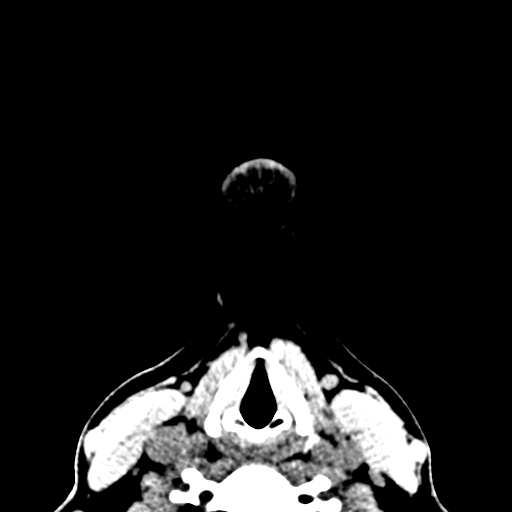
[im 6/84  bone]
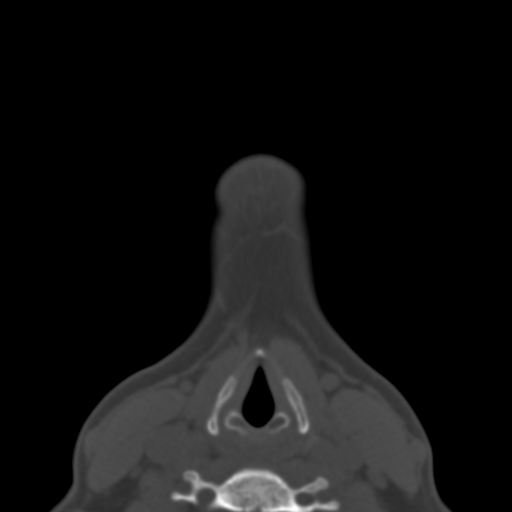
[im 15/84  bone]
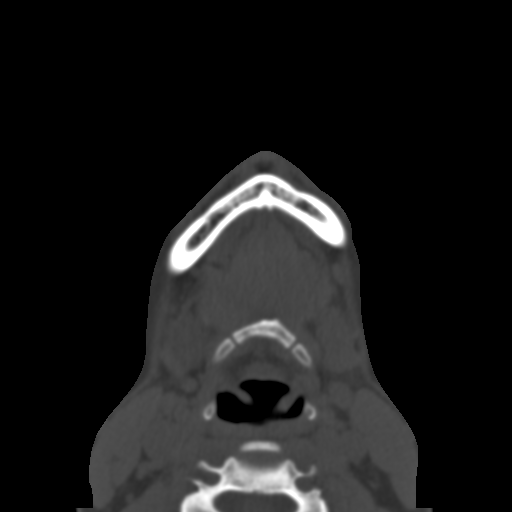
[im 23/84  bone]
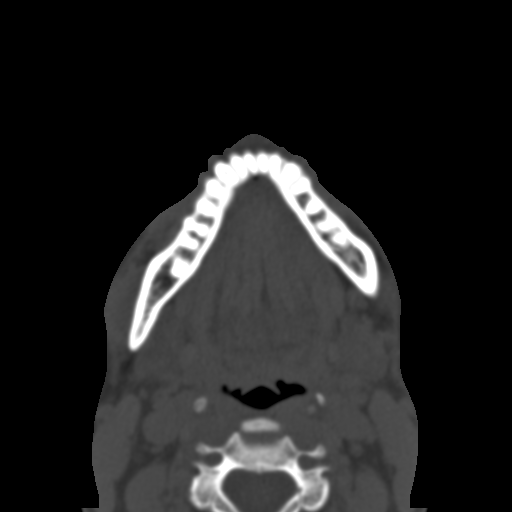
[im 29/84  bone]
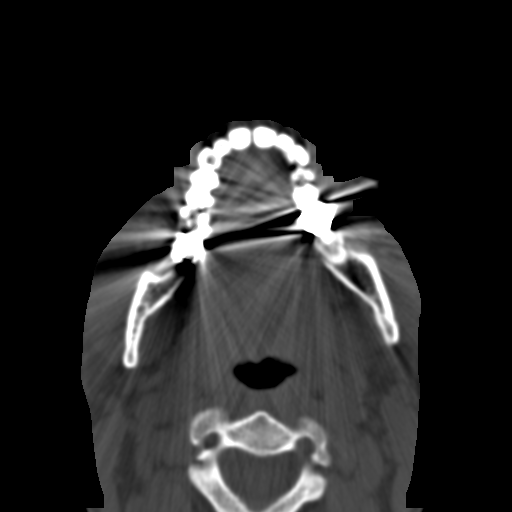
[im 38/84  brain]
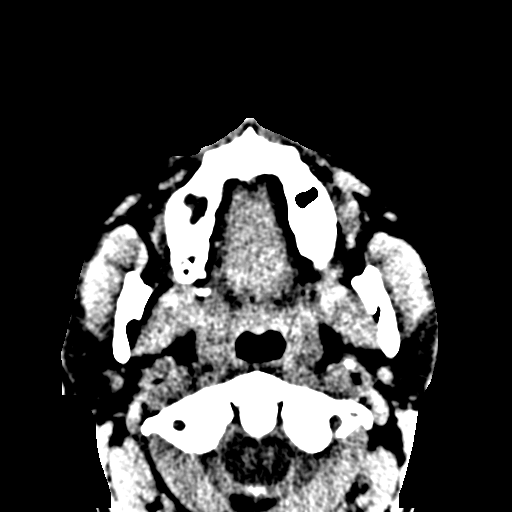
[im 38/84  bone]
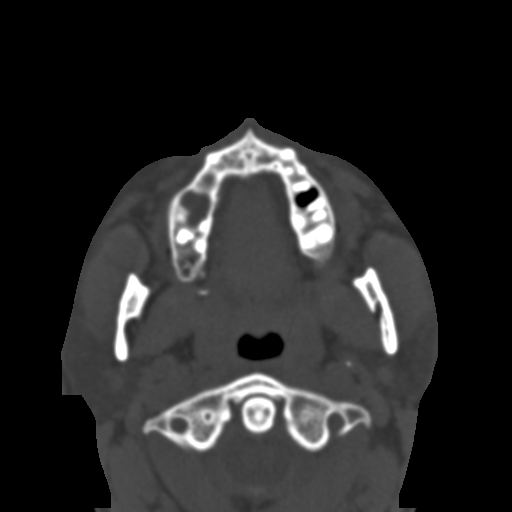
[im 46/84  bone]
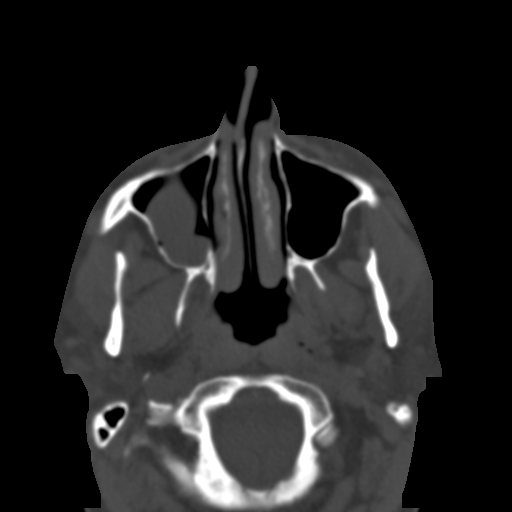
[im 55/84  bone]
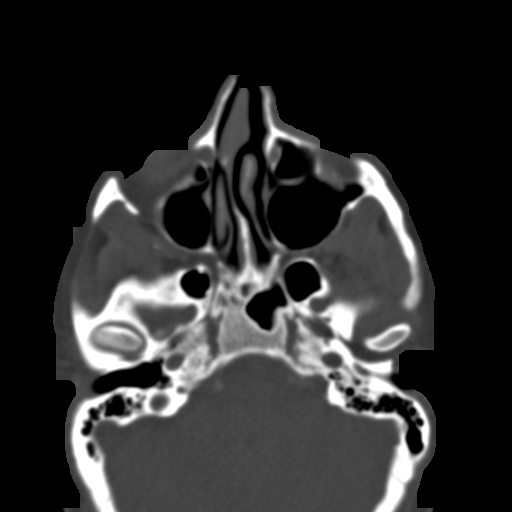
[im 63/84  bone]
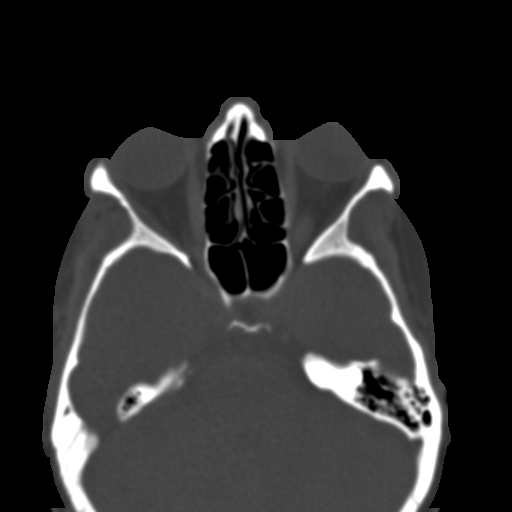
[im 69/84  brain]
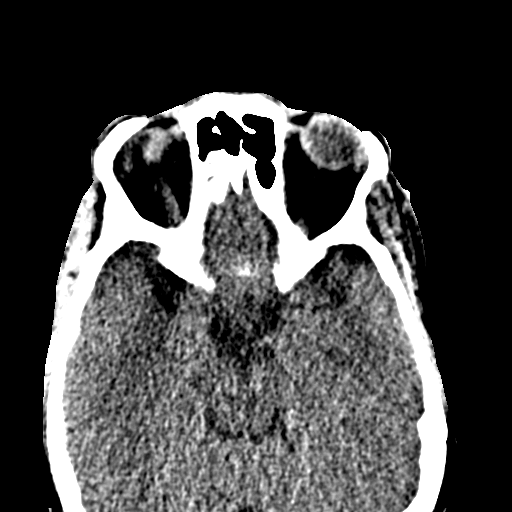
[im 69/84  bone]
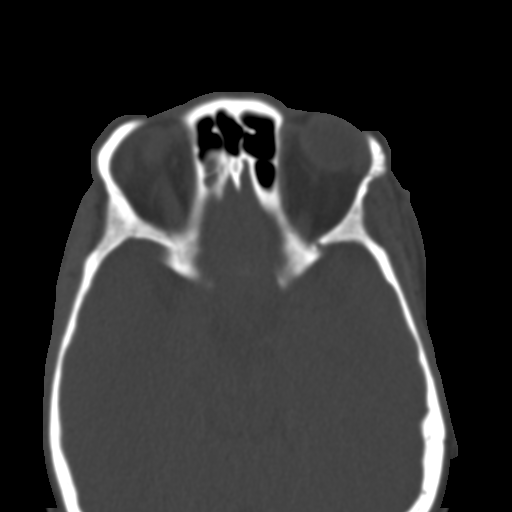
[im 78/84  bone]
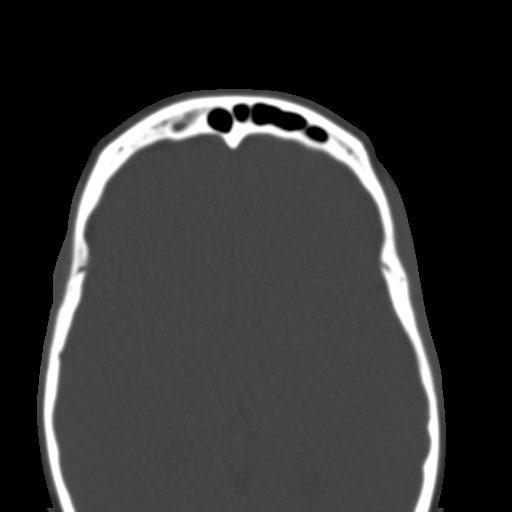

[Series 8: maxillofacial 2.0 coronal · coronal · 0.36mm/px · 3 of 81 slices shown]
[im 27/81  bone]
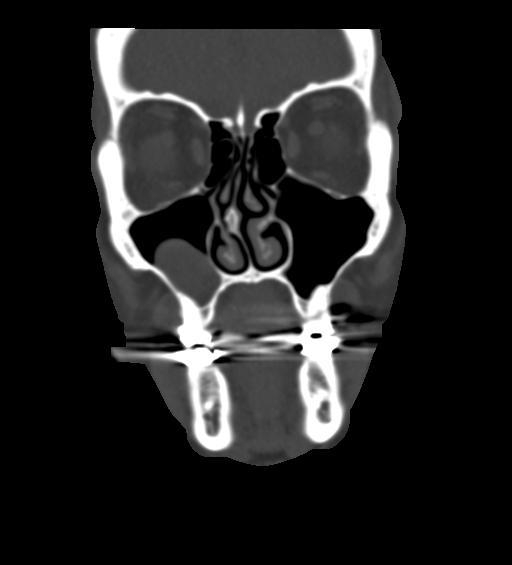
[im 36/81  bone]
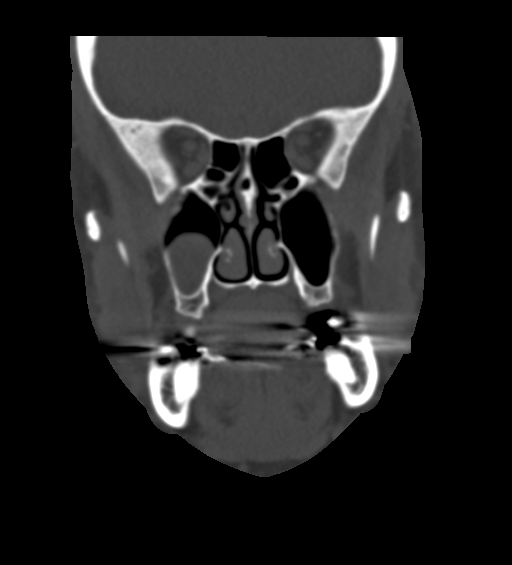
[im 45/81  bone]
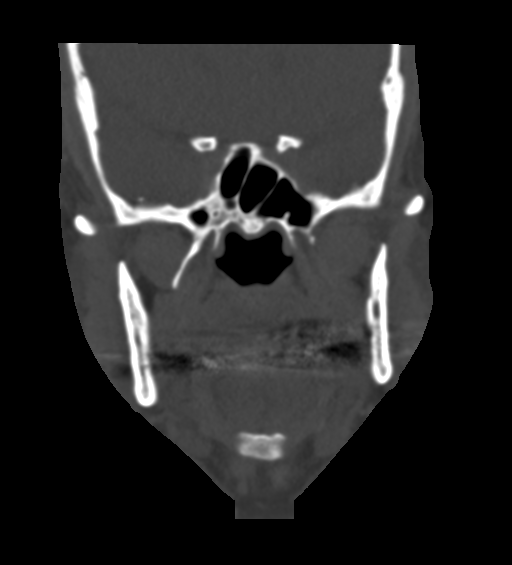

[Series 9: maxillofacial 2.0 sagittal · sagittal · 0.39mm/px · 3 of 75 slices shown]
[im 25/75  bone]
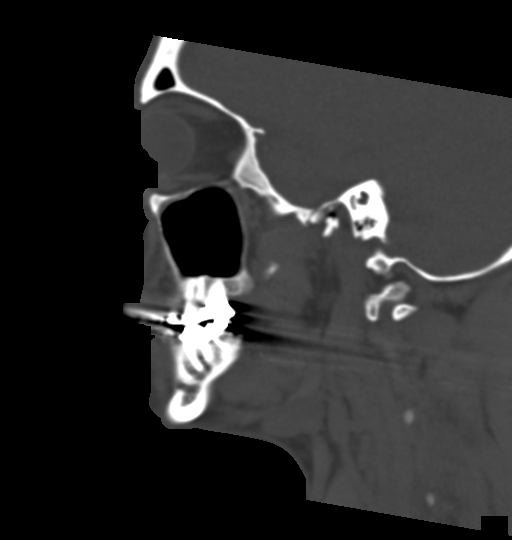
[im 38/75  bone]
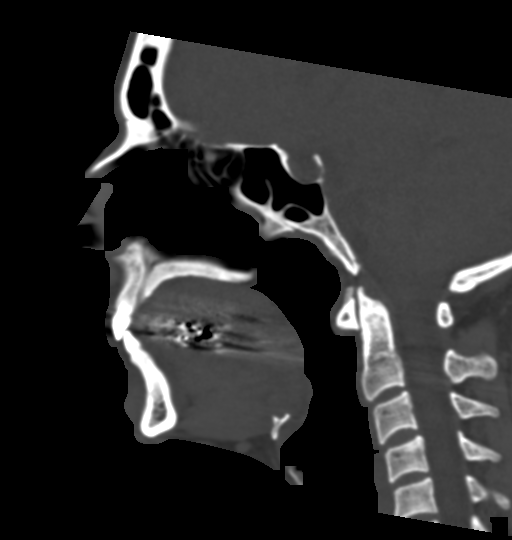
[im 50/75  bone]
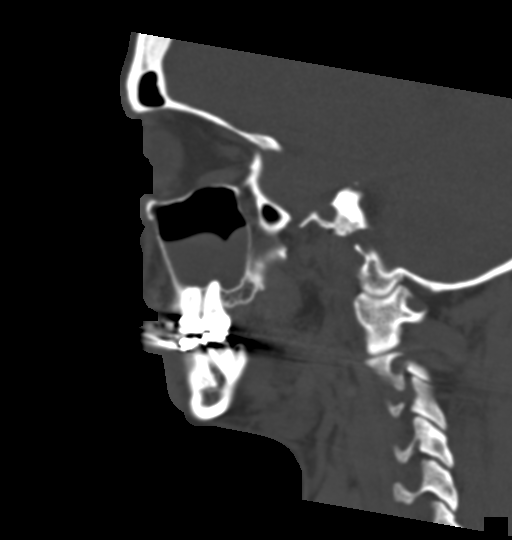

[16 of 47 positions shown; findings below may reference images not displayed]

FINDINGS: There is no visible facial fracture. Mild left malar soft tissue
swelling without significant hematoma. Slight left preseptal
periorbital soft tissue swelling without postseptal or intraconal
hemorrhage. Both globes are intact. There is no blowout injury. No
nasal bone fracture. No acute sinus fluid collection. Moderate
retention cyst right maxillary sinus. Nasal septal deviation left to
right with spurring approximately 5 mm. Upper cervical region
unremarkable. Negative intracranial compartment. No middle ear or
mastoid fluid. Retromalar soft tissues are intact. Unremarkable
nasopharynx and neck soft tissues.
IMPRESSION: Mild malar soft tissue swelling on the left without significant
hematoma. No facial fracture. No blowout injury.
